# Patient Record
Sex: Male | Born: 1965 | Race: White | Hispanic: No | Marital: Single | Smoking: Never smoker
Health system: Southern US, Community
[De-identification: ages and names within clinical notes are randomized; demographics above are authoritative.]

## PROBLEM LIST (undated history)

## (undated) HISTORY — PX: HERNIA REPAIR: SHX51

---

## 2004-08-30 ENCOUNTER — Emergency Department (HOSPITAL_COMMUNITY): Admission: EM | Admit: 2004-08-30 | Discharge: 2004-08-30 | Payer: Self-pay | Admitting: Emergency Medicine

## 2012-06-23 ENCOUNTER — Encounter (HOSPITAL_BASED_OUTPATIENT_CLINIC_OR_DEPARTMENT_OTHER): Payer: Self-pay | Admitting: *Deleted

## 2012-06-23 ENCOUNTER — Emergency Department (HOSPITAL_BASED_OUTPATIENT_CLINIC_OR_DEPARTMENT_OTHER): Payer: BC Managed Care – PPO

## 2012-06-23 ENCOUNTER — Emergency Department (HOSPITAL_BASED_OUTPATIENT_CLINIC_OR_DEPARTMENT_OTHER)
Admission: EM | Admit: 2012-06-23 | Discharge: 2012-06-23 | Disposition: A | Discharge status: Home / Self Care | Payer: BC Managed Care – PPO | Attending: Emergency Medicine | Admitting: Emergency Medicine

## 2012-06-23 DIAGNOSIS — M25579 Pain in unspecified ankle and joints of unspecified foot: Secondary | ICD-10-CM | POA: Insufficient documentation

## 2012-06-23 MED ORDER — IBUPROFEN 800 MG PO TABS: 800.0000 mg | ORAL_TABLET | Freq: Three times a day (TID) | ORAL | Status: AC

## 2012-06-23 MED ORDER — OXYCODONE-ACETAMINOPHEN 5-325 MG PO TABS
2.0000 | ORAL_TABLET | Freq: Once | ORAL | Status: AC
  Administered 2012-06-23: 2 via ORAL
  Filled 2012-06-23: qty 1

## 2012-06-23 MED ORDER — OXYCODONE-ACETAMINOPHEN 5-325 MG PO TABS
ORAL_TABLET | ORAL | Status: AC
  Filled 2012-06-23: qty 1

## 2012-06-23 MED ORDER — HYDROCODONE-ACETAMINOPHEN 5-500 MG PO TABS: 1.0000 | ORAL_TABLET | Freq: Four times a day (QID) | ORAL | Status: AC | PRN

## 2012-06-23 NOTE — ED Provider Notes (Signed)
History     CSN: [id number]  Arrival date & time 06/23/12  1949   First MD Initiated Contact with Patient 06/23/12 2007      Chief Complaint  Patient presents with  . Ankle Injury    (Consider location/radiation/quality/duration/timing/severity/associated sxs/prior treatment) HPI Patient presents to emergency department complaining of left ankle/heel pain and injury. Patient states that he was walking at work couple hours prior to arrival and fell a small "pop" like sensation in the posterior aspect of his heel. Patient states that pain was mild at first and described as a stiffness however as he continued to walk, drive home, and walk in his home he said he was having increasing pain so much so that the pain caused nausea. Pain is aggravated by movement and the patient states he has decreased range of motion due to pain in heel and ankle. Patient states he has no known medical problems and takes no medicines on regular basis. He took nothing for pain prior to arrival. He does have a primary care provider in town but does not have an Investment banker, operational. He denies numbness or tingling, changes in skin, or bruising. He denies swelling. Patient states pain radiates up posterior calf.   History reviewed. No pertinent past medical history.  Past Surgical History  Procedure Date  . Hernia repair     No family history on file.  History  Substance Use Topics  . Smoking status: Never Smoker   . Smokeless tobacco: Not on file  . Alcohol Use: Yes      Review of Systems  Gastrointestinal: Positive for nausea. Negative for vomiting.  Musculoskeletal: Negative for myalgias and joint swelling.  Skin: Negative for color change and wound.  Neurological: Negative for numbness.    Allergies  Review of patient's allergies indicates no known allergies.  Home Medications   Current Outpatient Rx  Name Route Sig Dispense Refill  . NAPROXEN SODIUM 220 MG PO TABS Oral Take 440 mg by mouth  daily as needed. For pain      BP 151/86  Pulse 92  Temp 97.8 F (36.6 C) (Oral)  Resp 20  Ht 6\' 5"  (1.956 m)  Wt 265 lb (120.203 kg)  BMI 31.42 kg/m2  SpO2 97%  Physical Exam  Nursing note and vitals reviewed. Constitutional: He is oriented to person, place, and time. He appears well-developed.  HENT:  Head: Normocephalic and atraumatic.  Eyes: Conjunctivae are normal.  Neck: Normal range of motion. Neck supple.  Cardiovascular: Normal rate.   Pulmonary/Chest: Effort normal.  Musculoskeletal: Normal range of motion. He exhibits tenderness.       TTP left ankle but no edema or bruising. Decreased FROM of ankle due to pain. No TTP of forefoot. Normal pedal pulse and cap refill. Normal sensation of entire foot.  Thompson's test negative. With some passive movement of foot with squeezing of calf.   Neurological: He is alert and oriented to person, place, and time.  Skin: Skin is warm and dry. No rash noted. No erythema. No pallor.    ED Course  Procedures (including critical care time)  PO percocet  Labs Reviewed - No data to display No results found.   1. Ankle pain     ASO and crutches.   MDM  No concern for complete achilles tendon rupture with Thompson's test negative. With some passive movement of foot with squeezing of calf.  No acute findings on xray. 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 LLE is neurovasc intact. Will give ortho follow up.         [place], [place] 06/23/12 2110

## 2012-06-23 NOTE — ED Notes (Signed)
5 hours ago he was walking and felt a pop in his left heel. Pain goes up the back of his lower leg.

## 2012-06-24 NOTE — ED Provider Notes (Signed)
Medical screening examination/treatment/procedure(s) were performed by non-physician practitioner and as supervising physician I was immediately available for consultation/collaboration.  Cyndra Numbers, MD 06/24/12 [phone]

## 2013-01-29 IMAGING — CR DG ANKLE COMPLETE 3+V*L*
3 series · 3 of 3 positions shown · non-contrast
Comparison: None.

CLINICAL DATA: Walking, pain, popping sensation

LEFT ANKLE COMPLETE - 3+ VIEW

[t ankle joint ap left]
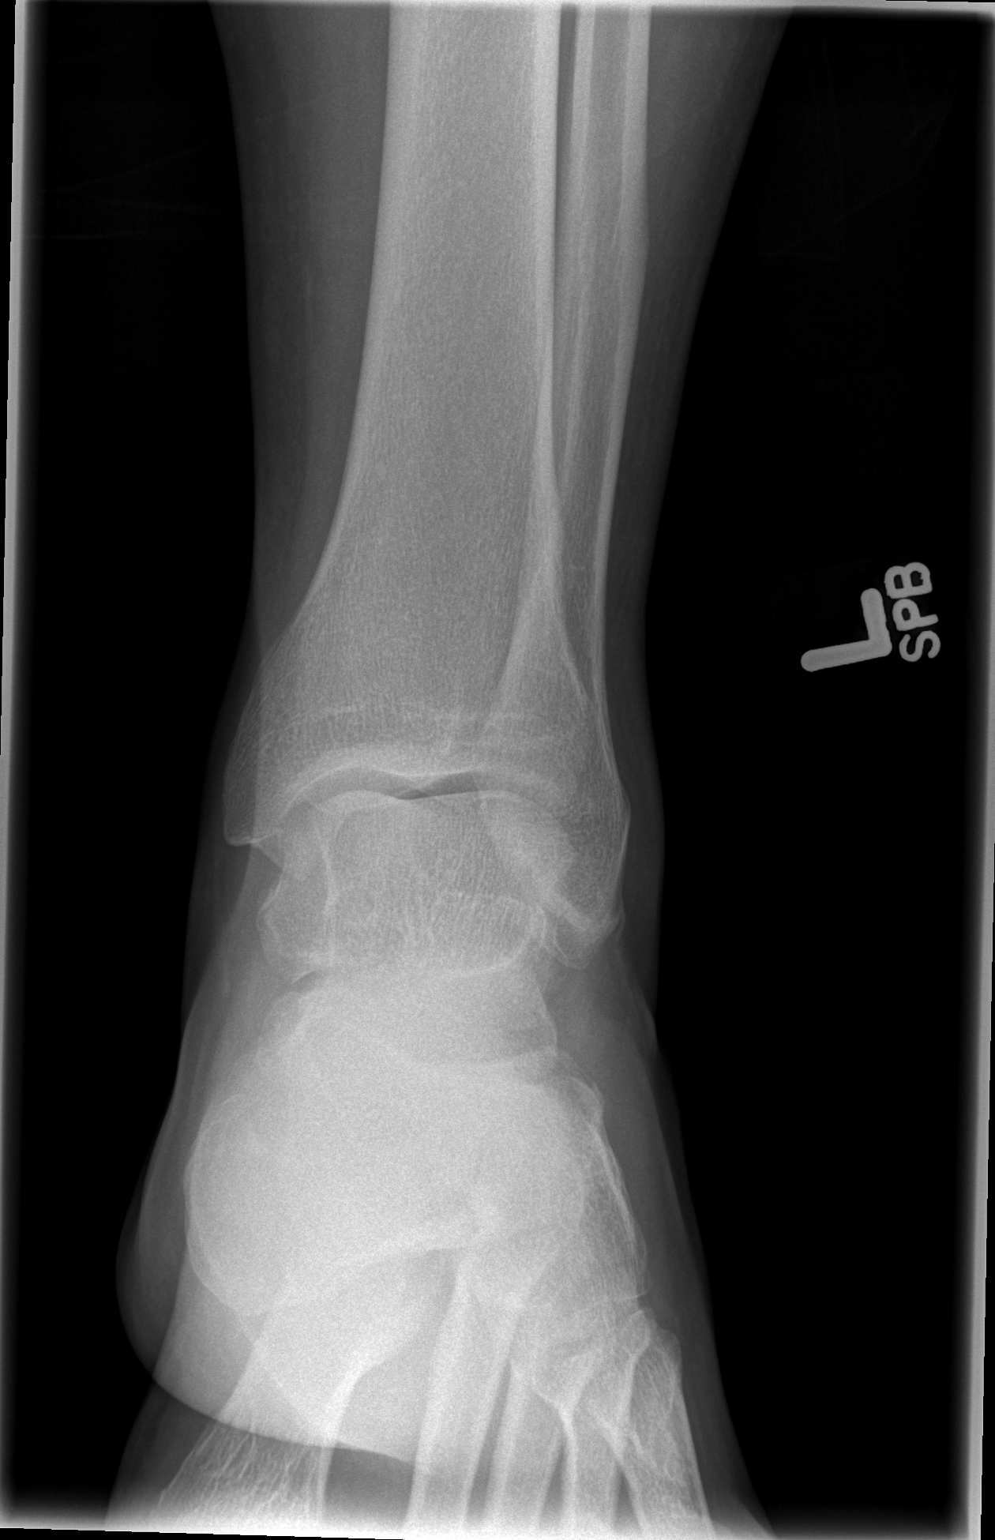

[t ankle joint lat left]
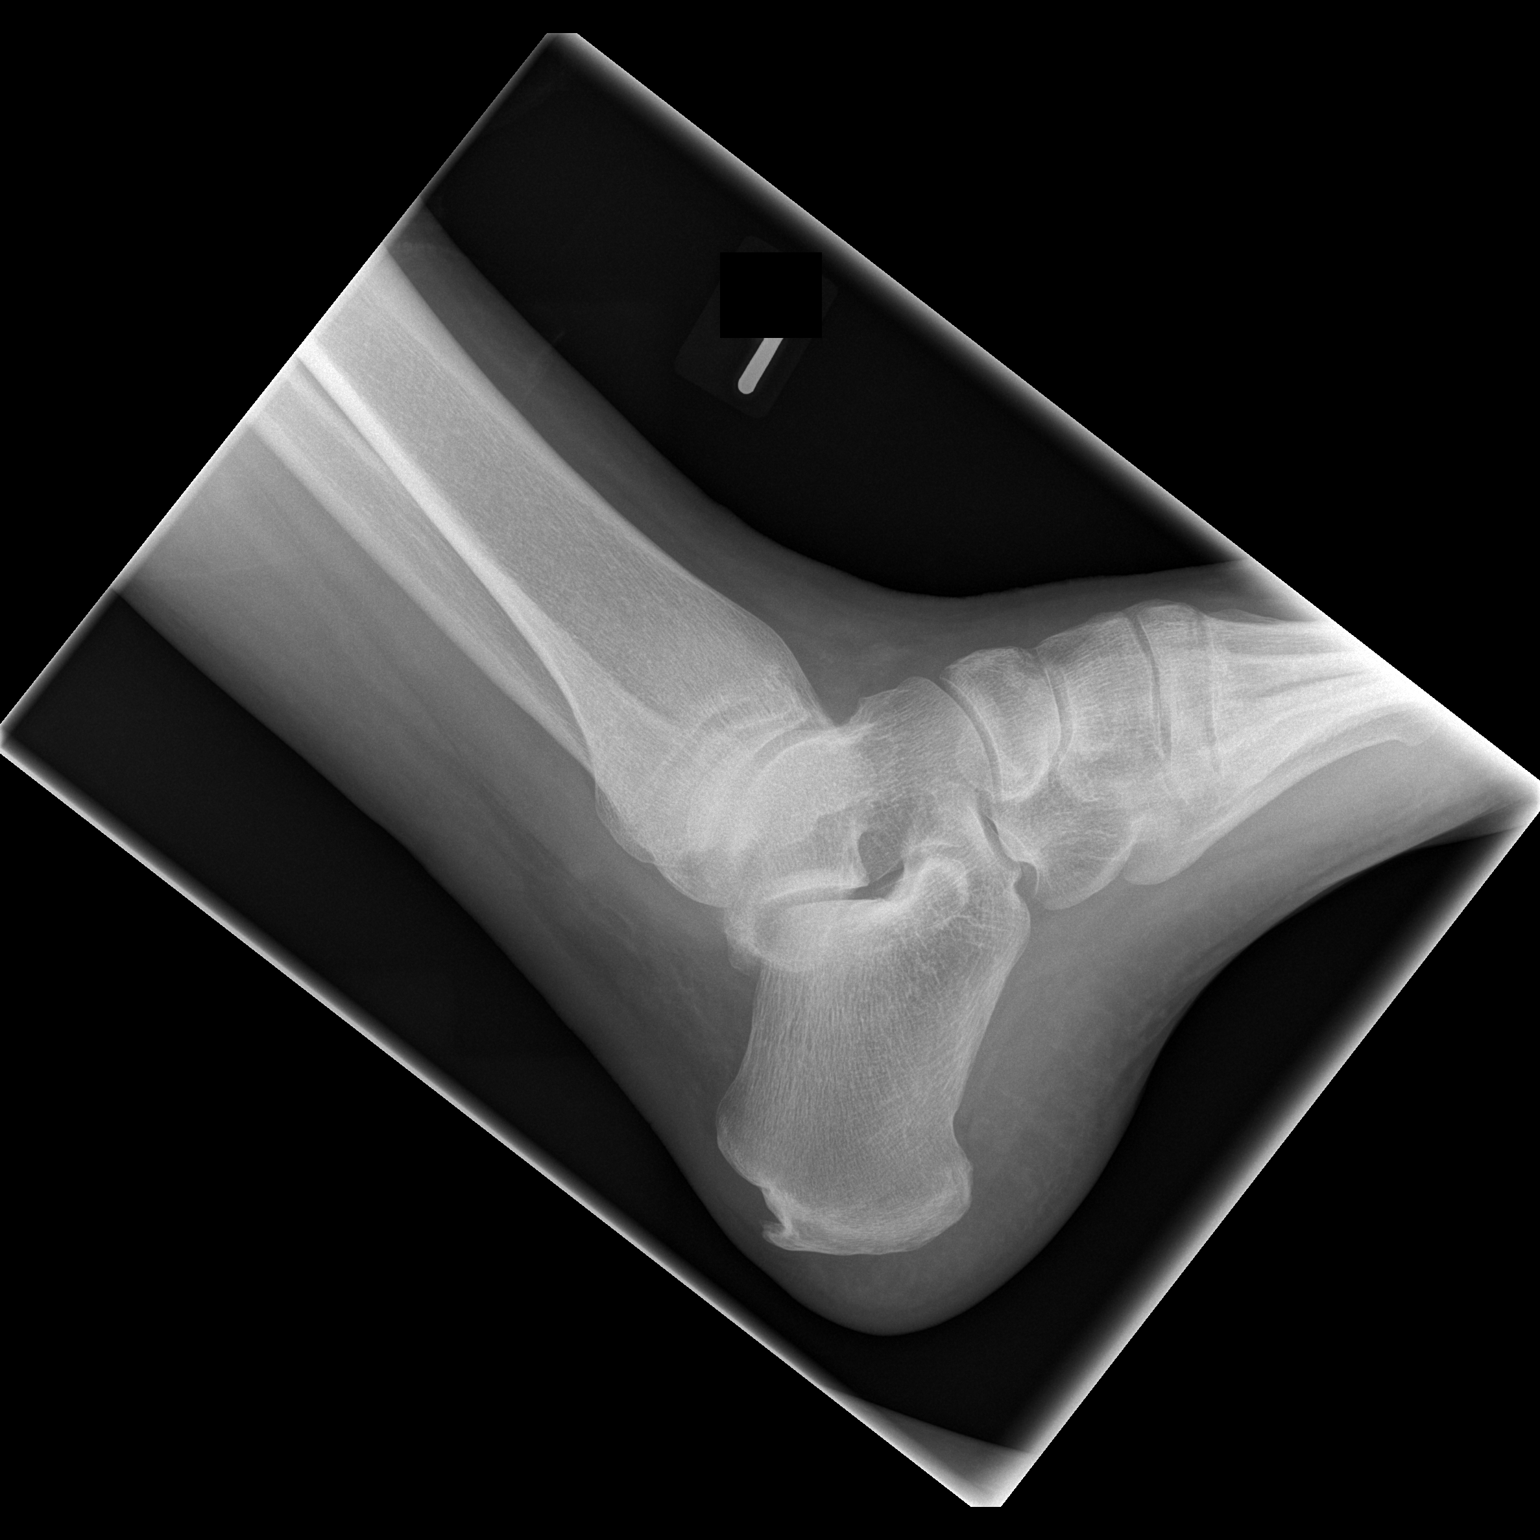

[t ankle joint oblique left]
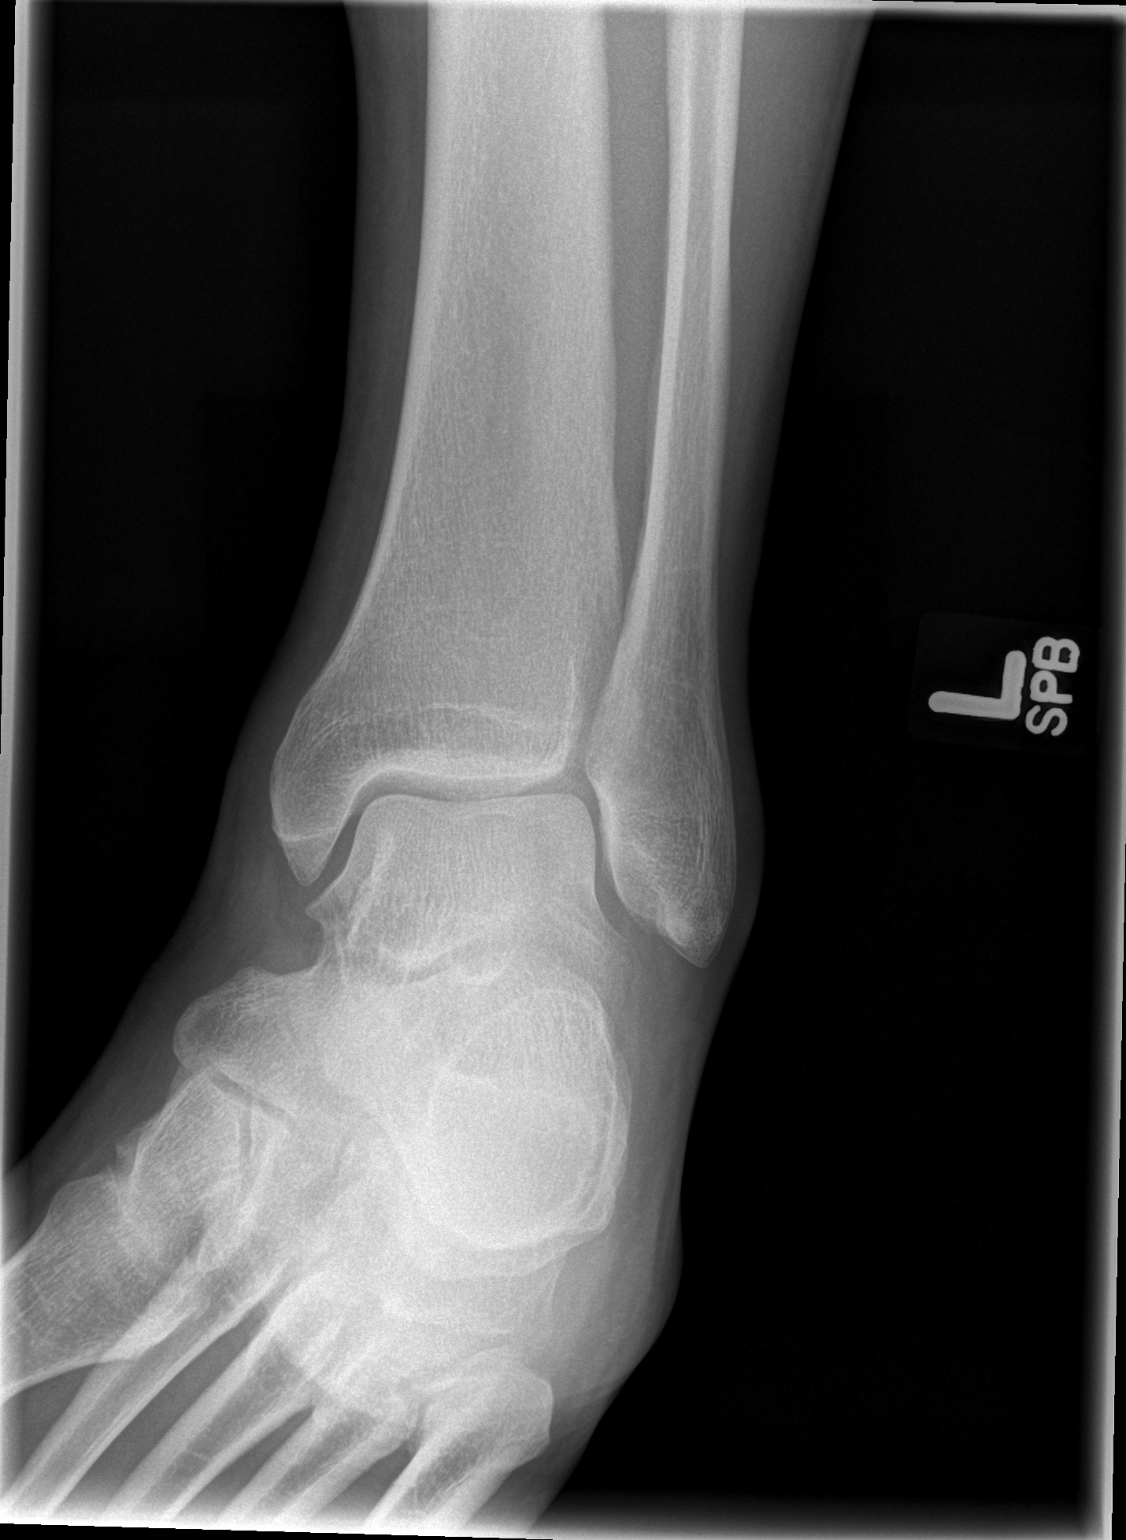

[3 of 3 positions shown; findings below may reference images not displayed]

FINDINGS: Normal alignment without fracture.  Intact malleoli,
talus and calcaneus.
IMPRESSION: No acute finding

## 2016-11-05 ENCOUNTER — Telehealth: Payer: Self-pay

## 2016-11-05 NOTE — Telephone Encounter (Signed)
Spoke with you yesterday. Wants to follow up on the procedure you were supposed to schedule for him. Please call patient and advice.
